# Patient Record
Sex: Female | Born: 1980 | Race: White | Hispanic: No | Smoking: Former smoker
Health system: Southern US, Community
[De-identification: ages and names within clinical notes are randomized; demographics above are authoritative.]

## PROBLEM LIST (undated history)

## (undated) DIAGNOSIS — Z789 Other specified health status: Secondary | ICD-10-CM

## (undated) HISTORY — PX: NO PAST SURGERIES: SHX2092

---

## 2013-05-25 ENCOUNTER — Emergency Department (HOSPITAL_COMMUNITY)
Admission: EM | Admit: 2013-05-25 | Discharge: 2013-05-25 | Disposition: A | Discharge status: Home / Self Care | Payer: Medicaid Other | Attending: Emergency Medicine | Admitting: Emergency Medicine

## 2013-05-25 ENCOUNTER — Emergency Department (HOSPITAL_COMMUNITY): Payer: Medicaid Other

## 2013-05-25 ENCOUNTER — Encounter (HOSPITAL_COMMUNITY): Payer: Self-pay | Admitting: Emergency Medicine

## 2013-05-25 DIAGNOSIS — Z23 Encounter for immunization: Secondary | ICD-10-CM | POA: Insufficient documentation

## 2013-05-25 DIAGNOSIS — Z3202 Encounter for pregnancy test, result negative: Secondary | ICD-10-CM | POA: Insufficient documentation

## 2013-05-25 DIAGNOSIS — F17200 Nicotine dependence, unspecified, uncomplicated: Secondary | ICD-10-CM | POA: Insufficient documentation

## 2013-05-25 DIAGNOSIS — S61209A Unspecified open wound of unspecified finger without damage to nail, initial encounter: Secondary | ICD-10-CM | POA: Insufficient documentation

## 2013-05-25 DIAGNOSIS — S2232XA Fracture of one rib, left side, initial encounter for closed fracture: Secondary | ICD-10-CM

## 2013-05-25 DIAGNOSIS — S3981XA Other specified injuries of abdomen, initial encounter: Secondary | ICD-10-CM | POA: Insufficient documentation

## 2013-05-25 DIAGNOSIS — T07XXXA Unspecified multiple injuries, initial encounter: Secondary | ICD-10-CM

## 2013-05-25 DIAGNOSIS — S62609B Fracture of unspecified phalanx of unspecified finger, initial encounter for open fracture: Secondary | ICD-10-CM

## 2013-05-25 DIAGNOSIS — S2249XA Multiple fractures of ribs, unspecified side, initial encounter for closed fracture: Secondary | ICD-10-CM | POA: Insufficient documentation

## 2013-05-25 LAB — POCT I-STAT, CHEM 8
Chloride: 103 mEq/L (ref 96–112)
HCT: 45 % (ref 36.0–46.0)
Hemoglobin: 15.3 g/dL — ABNORMAL HIGH (ref 12.0–15.0)
Potassium: 4.3 mEq/L (ref 3.5–5.1)
Sodium: 136 mEq/L (ref 135–145)

## 2013-05-25 LAB — POCT PREGNANCY, URINE: Preg Test, Ur: NEGATIVE

## 2013-05-25 MED ORDER — CEPHALEXIN 500 MG PO CAPS: 500.0000 mg | ORAL_CAPSULE | Freq: Three times a day (TID) | ORAL | Status: DC

## 2013-05-25 MED ORDER — MORPHINE SULFATE 4 MG/ML IJ SOLN
4.0000 mg | Freq: Once | INTRAMUSCULAR | Status: AC
  Administered 2013-05-25: 4 mg via INTRAVENOUS
  Filled 2013-05-25: qty 1

## 2013-05-25 MED ORDER — OXYCODONE-ACETAMINOPHEN 5-325 MG PO TABS
1.0000 | ORAL_TABLET | Freq: Once | ORAL | Status: AC
  Administered 2013-05-25: 1 via ORAL
  Filled 2013-05-25: qty 1

## 2013-05-25 MED ORDER — IOHEXOL 300 MG/ML  SOLN
100.0000 mL | Freq: Once | INTRAMUSCULAR | Status: AC | PRN
  Administered 2013-05-25: 100 mL via INTRAVENOUS

## 2013-05-25 MED ORDER — FENTANYL CITRATE 0.05 MG/ML IJ SOLN
50.0000 ug | Freq: Once | INTRAMUSCULAR | Status: AC
  Administered 2013-05-25: 50 ug via INTRAVENOUS
  Filled 2013-05-25: qty 2

## 2013-05-25 MED ORDER — OXYCODONE-ACETAMINOPHEN 5-325 MG PO TABS: 1.0000 | ORAL_TABLET | ORAL | Status: DC | PRN

## 2013-05-25 MED ORDER — CLINDAMYCIN HCL 150 MG PO CAPS: 300.0000 mg | ORAL_CAPSULE | Freq: Four times a day (QID) | ORAL | Status: DC

## 2013-05-25 MED ORDER — BUPIVACAINE HCL (PF) 0.25 % IJ SOLN
10.0000 mL | Freq: Once | INTRAMUSCULAR | Status: AC
  Administered 2013-05-25: 10 mL

## 2013-05-25 MED ORDER — ONDANSETRON HCL 4 MG/2ML IJ SOLN
4.0000 mg | Freq: Once | INTRAMUSCULAR | Status: AC
  Administered 2013-05-25: 4 mg via INTRAVENOUS
  Filled 2013-05-25: qty 2

## 2013-05-25 MED ORDER — HYDROCODONE-ACETAMINOPHEN 5-325 MG PO TABS: 1.0000 | ORAL_TABLET | Freq: Four times a day (QID) | ORAL | Status: DC | PRN

## 2013-05-25 MED ORDER — BUPIVACAINE HCL 0.25 % IJ SOLN
5.0000 mL | Freq: Once | INTRAMUSCULAR | Status: DC
  Filled 2013-05-25: qty 5

## 2013-05-25 MED ORDER — TETANUS-DIPHTH-ACELL PERTUSSIS 5-2.5-18.5 LF-MCG/0.5 IM SUSP
0.5000 mL | Freq: Once | INTRAMUSCULAR | Status: AC
  Administered 2013-05-25: 0.5 mL via INTRAMUSCULAR
  Filled 2013-05-25: qty 0.5

## 2013-05-25 NOTE — ED Notes (Signed)
PA at bedside.
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
## 2013-05-25 NOTE — ED Notes (Signed)
Spoke with Efraim Kaufmann, charge RN - patient will move to POD E when room available.

## 2013-05-25 NOTE — ED Notes (Signed)
Laceration to first left finger wrapped in petroleum gauze and cling.

## 2013-05-25 NOTE — ED Notes (Signed)
PA at bedside for procedure.
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
## 2013-05-25 NOTE — ED Notes (Signed)
Ortho at bedside.
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
## 2013-05-25 NOTE — ED Notes (Signed)
Dr. Thompson at bedside. 

## 2013-05-27 NOTE — ED Provider Notes (Signed)
Medical screening examination/treatment/procedure(s) were performed by non-physician practitioner and as supervising physician I was immediately available for consultation/collaboration.   Loren Racer, MD 05/27/13 [phone]

## 2013-06-17 ENCOUNTER — Ambulatory Visit: Payer: Medicaid Other | Attending: Orthopedic Surgery | Admitting: Occupational Therapy

## 2013-06-17 DIAGNOSIS — IMO0001 Reserved for inherently not codable concepts without codable children: Secondary | ICD-10-CM | POA: Insufficient documentation

## 2013-06-17 DIAGNOSIS — M25649 Stiffness of unspecified hand, not elsewhere classified: Secondary | ICD-10-CM | POA: Insufficient documentation

## 2013-06-17 DIAGNOSIS — M6281 Muscle weakness (generalized): Secondary | ICD-10-CM | POA: Insufficient documentation

## 2013-06-17 DIAGNOSIS — M25549 Pain in joints of unspecified hand: Secondary | ICD-10-CM | POA: Insufficient documentation

## 2013-06-22 ENCOUNTER — Ambulatory Visit: Payer: Medicaid Other | Admitting: Occupational Therapy

## 2013-07-15 ENCOUNTER — Ambulatory Visit: Payer: Medicaid Other | Attending: Orthopedic Surgery | Admitting: Occupational Therapy

## 2013-07-15 DIAGNOSIS — M25549 Pain in joints of unspecified hand: Secondary | ICD-10-CM | POA: Insufficient documentation

## 2013-07-15 DIAGNOSIS — IMO0001 Reserved for inherently not codable concepts without codable children: Secondary | ICD-10-CM | POA: Insufficient documentation

## 2013-07-15 DIAGNOSIS — M25649 Stiffness of unspecified hand, not elsewhere classified: Secondary | ICD-10-CM | POA: Insufficient documentation

## 2013-07-15 DIAGNOSIS — M6281 Muscle weakness (generalized): Secondary | ICD-10-CM | POA: Insufficient documentation

## 2013-07-20 ENCOUNTER — Ambulatory Visit: Payer: Medicaid Other | Admitting: Occupational Therapy
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
## 2022-11-04 NOTE — L&D Delivery Note (Cosign Needed Addendum)
LABOR COURSE Initial CVE 5/90/-2 and progressed to complete  Delivery Note Called to room and patient was complete and pushing. Head delivered ROP. No nuchal cord present. Shoulder and body delivered in usual fashion. At  1546 a viable and healthy female was delivered via Vaginal, Spontaneous (Presentation: vertex; ROP).  Infant with spontaneous cry, placed on mother's abdomen, dried and stimulated. Cord clamped x 2 after 1-minute delay, and cut by FOB. Cord blood drawn. Placenta delivered spontaneously with gentle cord traction. Appears intact. Fundus firm with massage, pitocin, TXA, and methergine. Labia, perineum, vagina, and cervix inspected with 1st degree perineal laceration.    APGAR: 9, 9; weight  8lb4oz.   Cord: 3VC with the following complications:none.   Cord pH: not sent  Anesthesia:  epidural Episiotomy: None Lacerations: 1st degree perineal Suture Repair: 2.0 chromic Est. Blood Loss (mL): 712  Mom to postpartum.  Baby to Couplet care / Skin to Skin.  Everhart, Kirstie, DO PGY - 1 4:35 PM     Fellow ATTESTATION  I was present and gloved for this delivery and agree with the above documentation in the resident's note.  Alfredia Ferguson, MD/[facility] (Faculty Practice) 06/10/2023, 8:34 PM

## 2023-06-10 ENCOUNTER — Inpatient Hospital Stay (HOSPITAL_COMMUNITY): Payer: Medicaid - Out of State | Admitting: Anesthesiology

## 2023-06-10 ENCOUNTER — Encounter (HOSPITAL_COMMUNITY): Payer: Self-pay | Admitting: Obstetrics and Gynecology

## 2023-06-10 ENCOUNTER — Inpatient Hospital Stay (HOSPITAL_BASED_OUTPATIENT_CLINIC_OR_DEPARTMENT_OTHER): Payer: Medicaid - Out of State

## 2023-06-10 ENCOUNTER — Inpatient Hospital Stay (HOSPITAL_COMMUNITY)
Admission: AD | Admit: 2023-06-10 | Discharge: 2023-06-12 | DRG: 806 | Disposition: A | Discharge status: Home / Self Care | Payer: Medicaid - Out of State | Attending: Obstetrics and Gynecology | Admitting: Obstetrics and Gynecology

## 2023-06-10 ENCOUNTER — Other Ambulatory Visit: Payer: Self-pay

## 2023-06-10 DIAGNOSIS — O09523 Supervision of elderly multigravida, third trimester: Secondary | ICD-10-CM

## 2023-06-10 DIAGNOSIS — O0930 Supervision of pregnancy with insufficient antenatal care, unspecified trimester: Secondary | ICD-10-CM

## 2023-06-10 DIAGNOSIS — Z3A39 39 weeks gestation of pregnancy: Secondary | ICD-10-CM | POA: Diagnosis not present

## 2023-06-10 DIAGNOSIS — O0933 Supervision of pregnancy with insufficient antenatal care, third trimester: Secondary | ICD-10-CM | POA: Diagnosis not present

## 2023-06-10 DIAGNOSIS — O09529 Supervision of elderly multigravida, unspecified trimester: Secondary | ICD-10-CM

## 2023-06-10 DIAGNOSIS — Z87891 Personal history of nicotine dependence: Secondary | ICD-10-CM

## 2023-06-10 DIAGNOSIS — O9081 Anemia of the puerperium: Secondary | ICD-10-CM | POA: Diagnosis not present

## 2023-06-10 DIAGNOSIS — O26893 Other specified pregnancy related conditions, third trimester: Secondary | ICD-10-CM | POA: Diagnosis present

## 2023-06-10 DIAGNOSIS — Z3A38 38 weeks gestation of pregnancy: Secondary | ICD-10-CM

## 2023-06-10 DIAGNOSIS — Z23 Encounter for immunization: Secondary | ICD-10-CM

## 2023-06-10 DIAGNOSIS — D62 Acute posthemorrhagic anemia: Secondary | ICD-10-CM | POA: Diagnosis not present

## 2023-06-10 HISTORY — DX: Other specified health status: Z78.9

## 2023-06-10 LAB — CBC WITH DIFFERENTIAL/PLATELET
Abs Immature Granulocytes: 0.23 10*3/uL — ABNORMAL HIGH (ref 0.00–0.07)
Basophils Absolute: 0.1 10*3/uL (ref 0.0–0.1)
Basophils Relative: 0 %
Eosinophils Absolute: 0 10*3/uL (ref 0.0–0.5)
Eosinophils Relative: 0 %
HCT: 28.5 % — ABNORMAL LOW (ref 36.0–46.0)
Hemoglobin: 8.9 g/dL — ABNORMAL LOW (ref 12.0–15.0)
Immature Granulocytes: 1 %
Lymphocytes Relative: 9 %
Lymphs Abs: 1.8 10*3/uL (ref 0.7–4.0)
MCH: 17.5 pg — ABNORMAL LOW (ref 26.0–34.0)
MCHC: 31.2 g/dL (ref 30.0–36.0)
MCV: 56.1 fL — ABNORMAL LOW (ref 80.0–100.0)
Monocytes Absolute: 1.7 10*3/uL — ABNORMAL HIGH (ref 0.1–1.0)
Monocytes Relative: 8 %
Neutro Abs: 16.6 10*3/uL — ABNORMAL HIGH (ref 1.7–7.7)
Neutrophils Relative %: 82 %
Platelets: 200 10*3/uL (ref 150–400)
RBC: 5.08 MIL/uL (ref 3.87–5.11)
RDW: 19.5 % — ABNORMAL HIGH (ref 11.5–15.5)
WBC: 20.3 10*3/uL — ABNORMAL HIGH (ref 4.0–10.5)
nRBC: 0.1 % (ref 0.0–0.2)

## 2023-06-10 LAB — HEPATITIS B SURFACE ANTIGEN: Hepatitis B Surface Ag: NONREACTIVE

## 2023-06-10 LAB — CBC
HCT: 34.5 % — ABNORMAL LOW (ref 36.0–46.0)
Hemoglobin: 10.7 g/dL — ABNORMAL LOW (ref 12.0–15.0)
MCH: 17.6 pg — ABNORMAL LOW (ref 26.0–34.0)
MCHC: 31 g/dL (ref 30.0–36.0)
MCV: 56.7 fL — ABNORMAL LOW (ref 80.0–100.0)
Platelets: 209 10*3/uL (ref 150–400)
RBC: 6.08 MIL/uL — ABNORMAL HIGH (ref 3.87–5.11)
RDW: 20.4 % — ABNORMAL HIGH (ref 11.5–15.5)
WBC: 10.6 10*3/uL — ABNORMAL HIGH (ref 4.0–10.5)
nRBC: 0 % (ref 0.0–0.2)

## 2023-06-10 LAB — GROUP B STREP BY PCR: Group B strep by PCR: NEGATIVE

## 2023-06-10 LAB — TYPE AND SCREEN
ABO/RH(D): B POS
Antibody Screen: NEGATIVE

## 2023-06-10 LAB — HEMOGLOBIN A1C
Hgb A1c MFr Bld: 5.6 % (ref 4.8–5.6)
Mean Plasma Glucose: 114.02 mg/dL

## 2023-06-10 LAB — RPR: RPR Ser Ql: NONREACTIVE

## 2023-06-10 LAB — HEPATITIS C ANTIBODY: HCV Ab: NONREACTIVE

## 2023-06-10 LAB — HIV ANTIBODY (ROUTINE TESTING W REFLEX): HIV Screen 4th Generation wRfx: NONREACTIVE

## 2023-06-10 MED ORDER — SOD CITRATE-CITRIC ACID 500-334 MG/5ML PO SOLN: 30.0000 mL | ORAL | Status: DC | PRN

## 2023-06-10 MED ORDER — SODIUM CHLORIDE 0.9 % IV SOLN
2.0000 g | Freq: Once | INTRAVENOUS | Status: AC
  Administered 2023-06-10: 2 g via INTRAVENOUS
  Filled 2023-06-10: qty 2000

## 2023-06-10 MED ORDER — ONDANSETRON HCL 4 MG/2ML IJ SOLN
4.0000 mg | Freq: Four times a day (QID) | INTRAMUSCULAR | Status: DC | PRN
  Administered 2023-06-10: 4 mg via INTRAVENOUS
  Filled 2023-06-10: qty 2

## 2023-06-10 MED ORDER — LACTATED RINGERS IV SOLN
500.0000 mL | Freq: Once | INTRAVENOUS | Status: AC
  Administered 2023-06-10: 500 mL via INTRAVENOUS

## 2023-06-10 MED ORDER — OXYTOCIN BOLUS FROM INFUSION
333.0000 mL | Freq: Once | INTRAVENOUS | Status: AC
  Administered 2023-06-10: 333 mL via INTRAVENOUS

## 2023-06-10 MED ORDER — ONDANSETRON HCL 4 MG/2ML IJ SOLN: 4.0000 mg | INTRAMUSCULAR | Status: DC | PRN

## 2023-06-10 MED ORDER — EPHEDRINE 5 MG/ML INJ: 10.0000 mg | INTRAVENOUS | Status: DC | PRN

## 2023-06-10 MED ORDER — MEASLES, MUMPS & RUBELLA VAC IJ SOLR: 0.5000 mL | Freq: Once | INTRAMUSCULAR | Status: DC

## 2023-06-10 MED ORDER — FENTANYL-BUPIVACAINE-NACL 0.5-0.125-0.9 MG/250ML-% EP SOLN
EPIDURAL | Status: DC | PRN
  Administered 2023-06-10: 12 mL/h via EPIDURAL

## 2023-06-10 MED ORDER — TRANEXAMIC ACID-NACL 1000-0.7 MG/100ML-% IV SOLN
INTRAVENOUS | Status: AC
  Filled 2023-06-10: qty 100

## 2023-06-10 MED ORDER — DIPHENHYDRAMINE HCL 25 MG PO CAPS: 25.0000 mg | ORAL_CAPSULE | Freq: Four times a day (QID) | ORAL | Status: DC | PRN

## 2023-06-10 MED ORDER — SIMETHICONE 80 MG PO CHEW: 80.0000 mg | CHEWABLE_TABLET | ORAL | Status: DC | PRN

## 2023-06-10 MED ORDER — FENTANYL-BUPIVACAINE-NACL 0.5-0.125-0.9 MG/250ML-% EP SOLN
12.0000 mL/h | EPIDURAL | Status: DC | PRN
  Filled 2023-06-10: qty 250

## 2023-06-10 MED ORDER — SODIUM CHLORIDE 0.9 % IV SOLN: 250.0000 mL | INTRAVENOUS | Status: DC | PRN

## 2023-06-10 MED ORDER — FENTANYL CITRATE (PF) 100 MCG/2ML IJ SOLN: 50.0000 ug | INTRAMUSCULAR | Status: DC | PRN

## 2023-06-10 MED ORDER — OXYTOCIN-SODIUM CHLORIDE 30-0.9 UT/500ML-% IV SOLN
1.0000 m[IU]/min | INTRAVENOUS | Status: DC
  Administered 2023-06-10: 2 m[IU]/min via INTRAVENOUS

## 2023-06-10 MED ORDER — TRANEXAMIC ACID-NACL 1000-0.7 MG/100ML-% IV SOLN
1000.0000 mg | INTRAVENOUS | Status: AC
  Administered 2023-06-10: 1000 mg via INTRAVENOUS

## 2023-06-10 MED ORDER — OXYTOCIN-SODIUM CHLORIDE 30-0.9 UT/500ML-% IV SOLN
2.5000 [IU]/h | INTRAVENOUS | Status: DC
  Administered 2023-06-10: 2.5 [IU]/h via INTRAVENOUS
  Filled 2023-06-10: qty 500

## 2023-06-10 MED ORDER — MISOPROSTOL 200 MCG PO TABS: 1000.0000 ug | ORAL_TABLET | Freq: Once | ORAL | Status: AC

## 2023-06-10 MED ORDER — MISOPROSTOL 200 MCG PO TABS
ORAL_TABLET | ORAL | Status: AC
  Administered 2023-06-10: 1000 ug via RECTAL
  Filled 2023-06-10: qty 1

## 2023-06-10 MED ORDER — LACTATED RINGERS IV SOLN
500.0000 mL | INTRAVENOUS | Status: DC | PRN
  Administered 2023-06-10: 500 mL via INTRAVENOUS

## 2023-06-10 MED ORDER — BUPIVACAINE HCL (PF) 0.25 % IJ SOLN: INTRAMUSCULAR | Status: DC | PRN

## 2023-06-10 MED ORDER — CEFAZOLIN SODIUM-DEXTROSE 2-4 GM/100ML-% IV SOLN
2.0000 g | Freq: Once | INTRAVENOUS | Status: AC
  Administered 2023-06-10: 2 g via INTRAVENOUS
  Filled 2023-06-10: qty 100

## 2023-06-10 MED ORDER — PHENYLEPHRINE 80 MCG/ML (10ML) SYRINGE FOR IV PUSH (FOR BLOOD PRESSURE SUPPORT): 80.0000 ug | PREFILLED_SYRINGE | INTRAVENOUS | Status: DC | PRN

## 2023-06-10 MED ORDER — SENNOSIDES-DOCUSATE SODIUM 8.6-50 MG PO TABS
2.0000 | ORAL_TABLET | ORAL | Status: DC
  Administered 2023-06-11 – 2023-06-12 (×2): 2 via ORAL
  Filled 2023-06-10 (×2): qty 2

## 2023-06-10 MED ORDER — ZOLPIDEM TARTRATE 5 MG PO TABS: 5.0000 mg | ORAL_TABLET | Freq: Every evening | ORAL | Status: DC | PRN

## 2023-06-10 MED ORDER — ACETAMINOPHEN 325 MG PO TABS
650.0000 mg | ORAL_TABLET | ORAL | Status: DC | PRN
  Administered 2023-06-10 (×2): 650 mg via ORAL
  Filled 2023-06-10 (×2): qty 2

## 2023-06-10 MED ORDER — BUPIVACAINE HCL (PF) 0.25 % IJ SOLN
INTRAMUSCULAR | Status: DC | PRN
  Administered 2023-06-10: 1 mL via INTRATHECAL

## 2023-06-10 MED ORDER — SODIUM CHLORIDE 0.9% FLUSH: 3.0000 mL | Freq: Two times a day (BID) | INTRAVENOUS | Status: DC

## 2023-06-10 MED ORDER — DIBUCAINE (PERIANAL) 1 % EX OINT: 1.0000 | TOPICAL_OINTMENT | CUTANEOUS | Status: DC | PRN

## 2023-06-10 MED ORDER — METHYLERGONOVINE MALEATE 0.2 MG/ML IJ SOLN
0.2000 mg | Freq: Once | INTRAMUSCULAR | Status: AC
  Administered 2023-06-10: 0.2 mg via INTRAMUSCULAR

## 2023-06-10 MED ORDER — PRENATAL MULTIVITAMIN CH
1.0000 | ORAL_TABLET | Freq: Every day | ORAL | Status: DC
  Administered 2023-06-11 – 2023-06-12 (×2): 1 via ORAL
  Filled 2023-06-10 (×2): qty 1

## 2023-06-10 MED ORDER — TETANUS-DIPHTH-ACELL PERTUSSIS 5-2.5-18.5 LF-MCG/0.5 IM SUSY
0.5000 mL | PREFILLED_SYRINGE | Freq: Once | INTRAMUSCULAR | Status: AC
  Administered 2023-06-11: 0.5 mL via INTRAMUSCULAR
  Filled 2023-06-10: qty 0.5

## 2023-06-10 MED ORDER — SODIUM CHLORIDE 0.9% FLUSH: 3.0000 mL | INTRAVENOUS | Status: DC | PRN

## 2023-06-10 MED ORDER — METHYLERGONOVINE MALEATE 0.2 MG/ML IJ SOLN
INTRAMUSCULAR | Status: AC
  Filled 2023-06-10: qty 1

## 2023-06-10 MED ORDER — COCONUT OIL OIL: 1.0000 | TOPICAL_OIL | Status: DC | PRN

## 2023-06-10 MED ORDER — BENZOCAINE-MENTHOL 20-0.5 % EX AERO: 1.0000 | INHALATION_SPRAY | CUTANEOUS | Status: DC | PRN

## 2023-06-10 MED ORDER — IBUPROFEN 600 MG PO TABS
600.0000 mg | ORAL_TABLET | Freq: Four times a day (QID) | ORAL | Status: DC
  Administered 2023-06-10 – 2023-06-12 (×9): 600 mg via ORAL
  Filled 2023-06-10 (×9): qty 1

## 2023-06-10 MED ORDER — TERBUTALINE SULFATE 1 MG/ML IJ SOLN: 0.2500 mg | Freq: Once | INTRAMUSCULAR | Status: DC | PRN

## 2023-06-10 MED ORDER — LIDOCAINE HCL (PF) 1 % IJ SOLN: 30.0000 mL | INTRAMUSCULAR | Status: DC | PRN

## 2023-06-10 MED ORDER — SODIUM CHLORIDE 0.9 % IV SOLN: 1.0000 g | INTRAVENOUS | Status: DC

## 2023-06-10 MED ORDER — ACETAMINOPHEN 325 MG PO TABS: 650.0000 mg | ORAL_TABLET | ORAL | Status: DC | PRN

## 2023-06-10 MED ORDER — DIPHENHYDRAMINE HCL 50 MG/ML IJ SOLN: 12.5000 mg | INTRAMUSCULAR | Status: DC | PRN

## 2023-06-10 MED ORDER — ONDANSETRON HCL 4 MG PO TABS: 4.0000 mg | ORAL_TABLET | ORAL | Status: DC | PRN

## 2023-06-10 MED ORDER — MISOPROSTOL 200 MCG PO TABS
ORAL_TABLET | ORAL | Status: AC
  Filled 2023-06-10: qty 5

## 2023-06-10 MED ORDER — PHENYLEPHRINE 80 MCG/ML (10ML) SYRINGE FOR IV PUSH (FOR BLOOD PRESSURE SUPPORT)
80.0000 ug | PREFILLED_SYRINGE | INTRAVENOUS | Status: DC | PRN
  Filled 2023-06-10: qty 10

## 2023-06-10 MED ORDER — LACTATED RINGERS IV SOLN: INTRAVENOUS | Status: DC

## 2023-06-10 MED ORDER — WITCH HAZEL-GLYCERIN EX PADS: 1.0000 | MEDICATED_PAD | CUTANEOUS | Status: DC | PRN

## 2023-06-10 NOTE — Lactation Note (Signed)
This note was copied from a baby's chart. Lactation Consultation Note  Patient Name: Catherine Baxter [id number] Date: 06/10/2023 Age:43 hours  Mom is breast/formula feeding. Declines Lactation services.   Maternal Data    Feeding Nipple Type: Nfant Extra Slow Flow (gold)  LATCH Score                    Lactation Tools Discussed/Used    Interventions    Discharge    Consult Status Consult Status: Complete    Hollye Pritt G 06/10/2023, 7:37 PM

## 2023-06-10 NOTE — Anesthesia Procedure Notes (Addendum)
Epidural Patient location during procedure: OB Start time: 06/10/2023 7:47 AM End time: 06/10/2023 7:57 AM  Staffing Anesthesiologist: Lannie Fields, DO Performed: anesthesiologist   Preanesthetic Checklist Completed: patient identified, IV checked, risks and benefits discussed, monitors and equipment checked, pre-op evaluation and timeout performed  Epidural Patient position: sitting Prep: DuraPrep and site prepped and draped Patient monitoring: continuous pulse ox, blood pressure, heart rate and cardiac monitor Approach: midline Location: L3-L4 Injection technique: LOR air  Needle:  Needle type: Tuohy  Needle gauge: 17 G Needle length: 9 cm Needle insertion depth: 5 cm Catheter type: closed end flexible Catheter size: 19 Gauge Catheter at skin depth: 10 cm Test dose: negative  Assessment Sensory level: T8 Events: blood not aspirated, no cerebrospinal fluid, injection not painful, no injection resistance, no paresthesia and negative IV test  Additional Notes Patient identified. Risks/Benefits/Options discussed with patient including but not limited to bleeding, infection, nerve damage, paralysis, failed block, incomplete pain control, headache, blood pressure changes, nausea, vomiting, reactions to medication both or allergic, itching and postpartum back pain. Confirmed with bedside nurse the patient's most recent platelet count. Confirmed with patient that they are not currently taking any anticoagulation, have any bleeding history or any family history of bleeding disorders. Patient expressed understanding and wished to proceed. All questions were answered. Sterile technique was used throughout the entire procedure. Please see nursing notes for vital signs. Test dose was given through epidural catheter and negative prior to continuing to dose epidural or start infusion. Warning signs of high block given to the patient including shortness of breath, tingling/numbness in  hands, complete motor block, or any concerning symptoms with instructions to call for help. Patient was given instructions on fall risk and not to get out of bed. All questions and concerns addressed with instructions to call with any issues or inadequate analgesia.    CSE performed with 24G pencan through tuohy, clear CSF, no issues.Reason for block:procedure for pain

## 2023-06-10 NOTE — Anesthesia Preprocedure Evaluation (Signed)
Anesthesia Evaluation  Patient identified by MRN, date of birth, ID band Patient awake    Reviewed: Allergy & Precautions, Patient's Chart, lab work & pertinent test results  Airway Mallampati: II  TM Distance: >3 FB Neck ROM: Full    Dental no notable dental hx.    Pulmonary neg pulmonary ROS, former smoker   Pulmonary exam normal breath sounds clear to auscultation       Cardiovascular negative cardio ROS Normal cardiovascular exam Rhythm:Regular Rate:Normal     Neuro/Psych negative neurological ROS  negative psych ROS   GI/Hepatic negative GI ROS, Neg liver ROS,,,  Endo/Other  negative endocrine ROS    Renal/GU negative Renal ROS  negative genitourinary   Musculoskeletal negative musculoskeletal ROS (+)    Abdominal   Peds negative pediatric ROS (+)  Hematology  (+) Blood dyscrasia, anemia Hb 10.7, plt 209   Anesthesia Other Findings   Reproductive/Obstetrics (+) Pregnancy                             Anesthesia Physical Anesthesia Plan  ASA: 2  Anesthesia Plan: Combined Spinal and Epidural   Post-op Pain Management:    Induction:   PONV Risk Score and Plan: 2  Airway Management Planned: Natural Airway  Additional Equipment: None  Intra-op Plan:   Post-operative Plan:   Informed Consent: I have reviewed the patients History and Physical, chart, labs and discussed the procedure including the risks, benefits and alternatives for the proposed anesthesia with the patient or authorized representative who has indicated his/her understanding and acceptance.       Plan Discussed with:   Anesthesia Plan Comments: (5th pregnancy, hx precipitous delivery- will proceed w/ CSE)       Anesthesia Quick Evaluation

## 2023-06-10 NOTE — Progress Notes (Signed)
Labor Progress Note Iceis Ebner is a 42 y.o. G5P4004 at [redacted]w[redacted]d presented for SOL, NPC S: Resting comfortably with epidural  O:  BP 121/71   Pulse 100   Temp 97.6 F (36.4 C) (Axillary)   Resp 17   Ht 5\' 2"  (1.575 m)   Wt 68.5 kg   BMI 27.62 kg/m  EFM: 130/moderate/15x15/no decels  CVE: Dilation: 8 Effacement (%): 90 Station: -2, -1 Presentation: Vertex Exam by:: Brynda Greathouse, RN   A&P: 42 y.o. [id number] [redacted]w[redacted]d presented for SOL, NPC #Labor: Progressing well. AROM [id number] clear/bloody show.  #Pain: epidural #FWB: cat 1 #GBS  pending , ampicillin ppx   No prenatal care: GC swab to be collected, RPR non reactive, HCAb, HBsAg, Rubella, HIV pending, A1C 5.6, CBC Hgb 10.7. UDS pending. Formal BSUS confirms IUP at 39 weeks, vertex. SW consult pp.   Jennifier Smitherman, DO 10:02 AM

## 2023-06-10 NOTE — MAU Note (Signed)
Pt arrived saying - Doesn't know LMP- or EDC.- but says she's due in Oct. Is from [place]-  Had 1 PNV  Denies HSV Has bloody show  G5P4- all vag

## 2023-06-10 NOTE — H&P (Signed)
  History     CSN: [id number]  Arrival date and time: 06/10/23 6440   Event Date/Time   First Provider Initiated Contact with Patient 06/10/23 [phone]      S: Ms. Catherine Baxter is a 42 y.o. G5P4004 at Unknown who has had no prenatal care.  She presents today for contractions. She reports contractions started at 0530. She states her EDD is in October, but is unsure of the exact date.  She reports some vaginal bleeding, but denies SROM.   Patient reports she had one prenatal visit, but review of paperwork reveals it was clinic visit for UPT confirming pregnancy.   OB History     Gravida  5   Para  4   Term  4   Preterm      AB  0   Living  4      SAB  0   IAB  0   Ectopic  0   Multiple      Live Births  4      Past Medical History:  Diagnosis Date   Medical history non-contributory    Past Surgical History:  Procedure Laterality Date   NO PAST SURGERIES      Family History: family history is not on file. Social History:  reports that she has quit smoking. Her smoking use included cigarettes. She has quit using smokeless tobacco. She reports that she does not currently use alcohol. She reports that she does not currently use drugs after having used the following drugs: Marijuana.  O: There were no vitals taken for this visit. GENERAL: Well-developed, well-nourished female in no acute distress.  HEAD: Normocephalic, atraumatic.  CHEST: Normal effort of breathing, regular heart rate ABDOMEN: Soft, nontender, gravid, FH 40cm  Cervical exam:  Dilation: 5 Effacement (%): 90 Station: -2 Presentation: Vertex Exam by:: Shanda Bumps, CNM Bloody Show Noted  Fetal Monitoring: Baseline: 130 Variability: Moderate Accelerations: None Decelerations: None Contractions: Palpates moderate   A: SIUP at [redacted]w[redacted]d  Active labor No Prenatal Care  P: Formal BSUS confirms IUP at 38.5 weeks Labs ordered Drug Screen and SW Consult needed for No PNC Admit to labor  L&D  Team notified   Cherre Robins MSN, CNM 06/10/2023, 6:37 AM

## 2023-06-11 ENCOUNTER — Encounter (HOSPITAL_COMMUNITY): Payer: Self-pay | Admitting: Obstetrics and Gynecology

## 2023-06-11 DIAGNOSIS — O09529 Supervision of elderly multigravida, unspecified trimester: Secondary | ICD-10-CM

## 2023-06-11 LAB — RUBELLA SCREEN: Rubella: 1.2 {index}

## 2023-06-11 MED ORDER — MEDROXYPROGESTERONE ACETATE 150 MG/ML IM SUSP
150.0000 mg | Freq: Once | INTRAMUSCULAR | Status: AC
  Administered 2023-06-11: 150 mg via INTRAMUSCULAR
  Filled 2023-06-11: qty 1

## 2023-06-11 MED ORDER — FERROUS SULFATE 325 (65 FE) MG PO TABS
325.0000 mg | ORAL_TABLET | ORAL | Status: DC
  Administered 2023-06-11: 325 mg via ORAL
  Filled 2023-06-11: qty 1

## 2023-06-11 NOTE — Clinical Social Work Maternal (Signed)
CLINICAL SOCIAL WORK MATERNAL/CHILD NOTE   Patient Details  Name: Catherine Baxter MRN: [id number] Date of Birth:    Date:  2024/02/06   Clinical Social Worker Initiating Note:  Willaim Rayas Isayah Ignasiak      Date/Time: Initiated:  06/11/23/1508      Child's Name:  Gilmore Laroche 03-18-2023    Biological Parents:  Mother, Father Catherine Baxter , Catherine Baxter 03/24/1981)    Need for Interpreter:  None    Reason for Referral:  Late or No Prenatal Care      Address:  [address] [place] [address]    Phone number:  [phone] (home)      Additional phone number:    Household Members/Support Persons (HM/SP):   Household Member/Support Person 1, Household Member/Support Person 2, Household Member/Support Person 3     HM/SP Name Relationship DOB or Age  HM/SP -1 Arvintino Lea significant other 03/24/1981  HM/SP -2 Roseanne Reno Phommachanh son 07/04/2009  HM/SP -3 Shawnee Knapp daughter 12/23/2020  HM/SP -4     HM/SP -5     HM/SP -6     HM/SP -7     HM/SP -8         Natural Supports (not living in the home):  Extended Family, Landscape architect Supports: None    Employment: Unemployed    Type of Work:      Education:  Some Economist arranged:     Surveyor, quantity Resources:  Media planner     Other Resources:  Sales executive  , WIC    Cultural/Religious Considerations Which May Impact Care:     Strengths:  Ability to meet basic needs  , Home prepared for child  , Pediatrician chosen    Psychotropic Medications:          Pediatrician:    [facility] area   Pediatrician List:    [facility]  [place]   [place]   [place] [facility]       Pediatrician Fax Number:     Risk Factors/Current Problems:  None    Cognitive State:  Alert  , Able to Concentrate      Mood/Affect:  Calm  , Interested      CSW Assessment: CSW received a consult for lack of prenatal care. CSW met with  MOB to complete assessment and offer support. CSW entered the room and observed MOB up eating, her room guest at bedside and the infant in the bassinet. CSW introduced self, CSW role and reason for visit, MOB was agreeable to visit and allowed room guest to remain in the room. CSW inquired about how MOB was feeling, MOB reported good. CSW inquired about MOB lack of prenatal care, MOB reported she recently moved here from [place] and was traveling back and forth a lot during the pregnancy. MOB reported she was seen in [place]. CSW explained the hospital drug screen policy due to lack of prenatal care, MOB verbalized understanding. CSW notified MOB infants UDS was negative and the CDS was pending, MOB denied any substance use during pregnancy.    CSW inquired about any MH hx, MOB denied any MH concerns. CSW provided education regarding the baby blues period vs. perinatal mood disorders, discussed treatment and gave resources for mental health follow up if concerns arise.  CSW recommends self-evaluation during the postpartum time period using the New Mom Checklist from Postpartum Progress and encouraged MOB to contact  a medical professional if symptoms are noted at any time. MOB identified her mom, FOB as her supports   CSW provided review of Sudden Infant Death Syndrome (SIDS) precautions.  MOB reported she has all necessary item for the infant including a bassinet, crib and car seat.  CSW identifies no further need for intervention and no barriers to discharge at this time.   CSW Plan/Description:  No Further Intervention Required/No Barriers to Discharge, Sudden Infant Death Syndrome (SIDS) Education, Perinatal Mood and Anxiety Disorder (PMADs) Education, Hospital Drug Screen Policy Information, CSW Will Continue to Monitor Umbilical Cord Tissue Drug Screen Results and Make Report if Sandy Salaam, LCSW Jan 22, 2023, 3:17 PM

## 2023-06-11 NOTE — Progress Notes (Addendum)
POSTPARTUM PROGRESS NOTE  Post Partum Day 1  Subjective:  Catherine Baxter is a 42 y.o. Y8M5784 s/p SVD at [redacted]w[redacted]d.  She reports she is doing well. No acute events overnight. She denies any problems with ambulating, voiding or po intake. Denies nausea or vomiting.  Patient has voided and stooled. Pain is well controlled.  Lochia is moderate with half a pad of bleeding every 2-3 hours.  Objective: Blood pressure (!) 104/45, pulse 87, temperature 97.7 F (36.5 C), temperature source Oral, resp. rate 16, height 5\' 2"  (1.575 m), weight 68.5 kg, SpO2 99%, unknown if currently breastfeeding.  Physical Exam:  General: alert, cooperative and no distress Chest: no respiratory distress Heart:regular rate, distal pulses intact Uterine Fundus: firm, appropriately tender. Fundal height 6 cm below umbilicus. DVT Evaluation: No calf swelling or tenderness Extremities: no LE edema Skin: warm, dry  Recent Labs    06/10/23 0700 06/10/23 2326  HGB 10.7* 8.9*  HCT 34.5* 28.5*    Assessment/Plan: Catherine Baxter is a 42 y.o. O9G2952 s/p SVD at [redacted]w[redacted]d   PPD#1 - Doing well  Routine postpartum care Microcytic anemia with normal RBC - likely 2/2 to acute blood loss. Asymptomatic.  - repeat CBC as above - Start PO iron supplementation Contraception: Plan for depo shot today, bilateral tubal ligation interval, in ~20 days Feeding: formula (Enfamil) Dispo: Plan for discharge today or tomorrow.   LOS: 1 day   Hanley Ben, Medical Student 06/11/2023, 7:42 AM    Fellow Attestation  I saw and evaluated the patient, performing the key elements of the service.I  personally performed or re-performed the history, physical exam, and medical decision making activities of this service and have verified that the service and findings are accurately documented in the medical student's note. I developed the management plan that is described in the medical student's note, and I agree with the content, with my  edits above.   Alfredia Ferguson, MD,[facility] OB Fellow, Faculty Practice  06/11/2023 2:30 PM

## 2023-06-11 NOTE — Anesthesia Postprocedure Evaluation (Signed)
Anesthesia Post Note  Patient: Gates Hingtgen  Procedure(s) Performed: AN AD HOC LABOR EPIDURAL     Patient location during evaluation: Mother Baby Anesthesia Type: Epidural Level of consciousness: awake and alert Pain management: pain level controlled Vital Signs Assessment: post-procedure vital signs reviewed and stable Respiratory status: spontaneous breathing, nonlabored ventilation and respiratory function stable Cardiovascular status: stable Postop Assessment: no headache, no backache and epidural receding Anesthetic complications: no   No notable events documented.  Last Vitals:  Vitals:   06/11/23 0156 06/11/23 0556  BP: 128/73 (!) 104/45  Pulse: 98 87  Resp: 16 16  Temp: 37.1 C 36.5 C  SpO2: 99% 99%    Last Pain:  Vitals:   06/11/23 0949  TempSrc:   PainSc: 0-No pain   Pain Goal:                   Salome Arnt

## 2023-06-11 NOTE — Discharge Summary (Signed)
Postpartum Discharge Summary  Date of Service updated***     Patient Name: Catherine Baxter DOB: 1981/08/19 MRN: [id number]  Date of admission: 06/10/2023 Delivery date:06/10/2023 Delivering provider: Alfredia Ferguson Date of discharge: 06/11/2023  Admitting diagnosis: Indication for care or intervention in labor or delivery [O75.9] Intrauterine pregnancy: [redacted]w[redacted]d     Secondary diagnosis:  Principal Problem:   Indication for care or intervention in labor or delivery Active Problems:   AMA (advanced maternal age) multigravida 35+  Additional problems: none    Discharge diagnosis: Term Pregnancy Delivered                                              Post partum procedures: none Augmentation: AROM Complications: None  Hospital course: Onset of Labor With Vaginal Delivery      42 y.o. yo G5P5005 at [redacted]w[redacted]d was admitted in Latent Labor on 06/10/2023. Labor course was complicated by not having prenatal care.  Membrane Rupture Time/Date: 9:21 AM,06/10/2023  Delivery Method:Vaginal, Spontaneous Operative Delivery:N/A Episiotomy: None Lacerations:  1st degree Patient had a postpartum course remarkable for having a SW consult with no barriers to d/c.  She is ambulating, tolerating a regular diet, passing flatus, and urinating well. Patient is discharged home in stable condition on 06/11/23.  Newborn Data: Birth date:06/10/2023 Birth time:3:46 PM Gender:Female Living status:Living Apgars:9 ,9  Weight:3730 g (8lb 3.6oz)  Magnesium Sulfate received: No BMZ received: No Rhophylac:N/A MMR:N/A T-DaP: offered postpartum Flu: No Transfusion:No  Physical exam  Vitals:   06/11/23 0156 06/11/23 0556 06/11/23 1409 06/11/23 2222  BP: 128/73 (!) 104/45 118/69 132/80  Pulse: 98 87 99 84  Resp: 16 16 18 19   Temp: 98.7 F (37.1 C) 97.7 F (36.5 C) 98 F (36.7 C) 98.4 F (36.9 C)  TempSrc: Oral Oral Oral Oral  SpO2: 99% 99% 100% 100%  Weight:      Height:       General: alert and  cooperative Lochia: appropriate Uterine Fundus: firm Incision: N/A DVT Evaluation: No evidence of DVT seen on physical exam. Labs: Lab Results  Component Value Date   WBC 20.3 (H) 06/10/2023   HGB 8.9 (L) 06/10/2023   HCT 28.5 (L) 06/10/2023   MCV 56.1 (L) 06/10/2023   PLT 200 06/10/2023      Latest Ref Rng & Units 05/25/2013   12:31 PM  CMP  Glucose 70 - 99 mg/dL 784   BUN 6 - 23 mg/dL 11   Creatinine 6.96 - 1.10 mg/dL 2.95   Sodium 284 - 132 mEq/L 136   Potassium 3.5 - 5.1 mEq/L 4.3   Chloride 96 - 112 mEq/L 103    Edinburgh Score:    06/10/2023   10:50 PM  Edinburgh Postnatal Depression Scale Screening Tool  I have been able to laugh and see the funny side of things. 0  I have looked forward with enjoyment to things. 0  I have blamed myself unnecessarily when things went wrong. 0  I have been anxious or worried for no good reason. 0  I have felt scared or panicky for no good reason. 0  Things have been getting on top of me. 0  I have been so unhappy that I have had difficulty sleeping. 0  I have felt sad or miserable. 0  I have been so unhappy that I have been crying. 0  The thought of harming myself has occurred to me. 0  Edinburgh Postnatal Depression Scale Total 0     After visit meds:  Allergies as of 06/11/2023   No Known Allergies   Med Rec must be completed prior to using this [facility]***        Discharge home in stable condition Infant Feeding: {Baby feeding:23562} Infant Disposition:{CHL IP OB HOME WITH ZOXWRU:04540} Discharge instruction: per After Visit Summary and Postpartum booklet. Activity: Advance as tolerated. Pelvic rest for 6 weeks.  Diet: routine diet Future Appointments:No future appointments. Follow up Visit:  Arabella Merles, CNM  P [facility] Admin Pool (No prenatal care)  Please schedule this patient for Postpartum visit in: 6 weeks with the following provider: Any provider In-Person For C/S patients schedule nurse incision  check in weeks 2 weeks: no Low risk pregnancy complicated by: no prenatal Delivery mode:  SVD Anticipated Birth Control:  other/unsure PP Procedures needed: none Schedule Integrated BH visit: no   06/11/2023 Arabella Merles, CNM

## 2023-06-12 DIAGNOSIS — O0930 Supervision of pregnancy with insufficient antenatal care, unspecified trimester: Secondary | ICD-10-CM

## 2023-06-12 MED ORDER — FERROUS SULFATE 325 (65 FE) MG PO TABS: 325.0000 mg | ORAL_TABLET | ORAL | 3 refills | Status: DC

## 2023-06-12 MED ORDER — IBUPROFEN 600 MG PO TABS: 600.0000 mg | ORAL_TABLET | Freq: Four times a day (QID) | ORAL | 0 refills | Status: DC | PRN

## 2023-07-03 ENCOUNTER — Telehealth (HOSPITAL_COMMUNITY): Payer: Self-pay

## 2023-07-03 NOTE — Telephone Encounter (Signed)
07/03/2023 1835  Name: Catherine Baxter MRN: [id number] DOB: [birth year]  Reason for Call:  Transition of Care Hospital Discharge Call  Contact Status: Patient Contact Status: Complete  Language assistant needed: Interpreter Mode: Interpreter Not Needed        Follow-Up Questions: Do You Have Any Concerns About Your Health As You Heal From Delivery?: No Do You Have Any Concerns About Your Infants Health?: No  Edinburgh Postnatal Depression Scale:  In the Past 7 Days:    PHQ2-9 Depression Scale:     Discharge Follow-up: Edinburgh score requires follow up?:  (Patient states that she had a nurse home visit yesterday and completed EPDS with home visit nurse. She states that she is feeling good emotionally. EPDS declined at this time.) Patient was advised of the following resources:: Breastfeeding Support Group, Support Group  Post-discharge interventions: Reviewed Newborn Safe Sleep Practices  Signature Signe Colt

## 2023-07-28 ENCOUNTER — Ambulatory Visit: Payer: PRIVATE HEALTH INSURANCE | Admitting: Obstetrics and Gynecology

## 2023-08-04 ENCOUNTER — Encounter: Payer: Self-pay | Admitting: Obstetrics & Gynecology

## 2023-08-04 ENCOUNTER — Other Ambulatory Visit: Payer: Self-pay

## 2023-08-04 ENCOUNTER — Ambulatory Visit (INDEPENDENT_AMBULATORY_CARE_PROVIDER_SITE_OTHER): Payer: PRIVATE HEALTH INSURANCE | Admitting: Obstetrics & Gynecology

## 2023-08-04 NOTE — Progress Notes (Signed)
Post Partum Visit Note  Catherine Baxter is a 42 y.o. Z6X0960 female who presents for a postpartum visit. She is 7 weeks postpartum following a normal spontaneous vaginal delivery.  I have fully reviewed the prenatal and intrapartum course. The delivery was at [redacted]w[redacted]d gestational weeks.  Anesthesia: epidural. Postpartum course has been good. Baby is doing well. Baby is feeding by bottle - Enfamil . Bleeding thin lochia. Bowel function is normal. Bladder function is normal. Patient is not sexually active. Contraception method is Depo-Provera injections. Postpartum depression screening: negative.   The pregnancy intention screening data noted above was reviewed. Potential methods of contraception were discussed. The patient elected to proceed with No data recorded.   Edinburgh Postnatal Depression Scale - 08/04/23 1356       Edinburgh Postnatal Depression Scale:  In the Past 7 Days   I have been able to laugh and see the funny side of things. 0    I have looked forward with enjoyment to things. 0    I have blamed myself unnecessarily when things went wrong. 0    I have been anxious or worried for no good reason. 0    I have felt scared or panicky for no good reason. 0    Things have been getting on top of me. 0    I have been so unhappy that I have had difficulty sleeping. 0    I have felt sad or miserable. 0    I have been so unhappy that I have been crying. 0    The thought of harming myself has occurred to me. 0    Edinburgh Postnatal Depression Scale Total 0             Health Maintenance Due  Topic Date Due   Cervical Cancer Screening (HPV/Pap Cotest)  Never done   INFLUENZA VACCINE  06/05/2023   COVID-19 Vaccine (3 - 2023-24 season) 07/06/2023    The following portions of the patient's history were reviewed and updated as appropriate: allergies, current medications, past family history, past medical history, past social history, past surgical history, and problem  list.  Review of Systems Pertinent items are noted in HPI.  Objective:  BP 123/76   Pulse 78   Wt 140 lb (63.5 kg)   Breastfeeding No   BMI 25.61 kg/m    General:  alert, cooperative, and no distress   Breasts:  not indicated  Lungs: Effort normal  Heart:  regular rate and rhythm  Abdomen: soft, non-tender; bowel sounds normal; no masses,  no organomegaly   Wound   GU exam:  not indicated       Assessment:    There are no diagnoses linked to this encounter.  normal postpartum exam.   Plan:   Essential components of care per ACOG recommendations:  1.  Mood and well being: Patient with negative depression screening today. Reviewed local resources for support.  - Patient tobacco use? No.   - hx of drug use? No.    2. Infant care and feeding:  -Patient currently breastmilk feeding? No.  -Social determinants of health (SDOH) reviewed in EPIC. No concerns  3. Sexuality, contraception and birth spacing - Patient does not want a pregnancy in the next year.  Desired family size is 5 children.  - Reviewed reproductive life planning. Reviewed contraceptive methods based on pt preferences and effectiveness.  Patient desired Hormonal Injection today.   - Discussed birth spacing of 18 months  4. Sleep and  fatigue -Encouraged family/partner/community support of 4 hrs of uninterrupted sleep to help with mood and fatigue  5. Physical Recovery  - Discussed patients delivery and complications. She describes her labor as good. - Patient had a Vaginal, no problems at delivery. Patient had a 1st degree laceration. Perineal healing reviewed. Patient expressed understanding - Patient has urinary incontinence? No. - Patient is safe to resume physical and sexual activity  6.  Health Maintenance - HM due items addressed Yes - Last pap smear No results found for: "DIAGPAP" Pap smear not done at today's visit.  -Breast Cancer screening indicated? Yes. Patient referred today for mammogram.    7. Chronic Disease/Pregnancy Condition follow up:  routine gyn care  - PCP follow up  Scheryl Darter, MD [facility], [facility]

## 2023-08-29 ENCOUNTER — Ambulatory Visit: Payer: Self-pay | Admitting: Obstetrics and Gynecology

## 2024-02-02 NOTE — Progress Notes (Unsigned)
 ANNUAL EXAM Patient name: Catherine Baxter MRN [id number]  Date of birth: Jun 28, 1981 Chief Complaint:   No chief complaint on file.  History of Present Illness:   Catherine Baxter is a 43 y.o. B1Y7829 {race:25618} female being seen today for a routine annual exam.  Current complaints: ***  No LMP recorded. Patient has had an injection.   The pregnancy intention screening data noted above was reviewed. Potential methods of contraception were discussed. The patient elected to proceed with No data recorded.   Last pap ***. Results were: {Pap findings:25134}. H/O abnormal pap: {yes/yes***/no:23866} Last mammogram: ***. Results were: {normal, abnormal, n/a:23837}. Family h/o breast cancer: {yes***/no:23838} Last colonoscopy: ***. Results were: {normal, abnormal, n/a:23837}. Family h/o colorectal cancer: {yes***/no:23838}      No data to display               No data to display           Review of Systems:   Pertinent items are noted in HPI Denies any headaches, blurred vision, fatigue, shortness of breath, chest pain, abdominal pain, abnormal vaginal discharge/itching/odor/irritation, problems with periods, bowel movements, urination, or intercourse unless otherwise stated above. Pertinent History Reviewed:  Reviewed past medical,surgical, social and family history.  Reviewed problem list, medications and allergies. Physical Assessment:  There were no vitals filed for this visit.There is no height or weight on file to calculate BMI.        Physical Examination:   General appearance - well appearing, and in no distress  Mental status - alert, oriented to person, place, and time  Psych:  She has a normal mood and affect  Skin - warm and dry, normal color, no suspicious lesions noted  Chest - effort normal, all lung fields clear to auscultation bilaterally  Heart - normal rate and regular rhythm  Neck:  midline trachea, no thyromegaly or nodules  Breasts - breasts appear  normal, no suspicious masses, no skin or nipple changes or  axillary nodes  Abdomen - soft, nontender, nondistended, no masses or organomegaly  Pelvic - VULVA: normal appearing vulva with no masses, tenderness or lesions  VAGINA: normal appearing vagina with normal color and discharge, no lesions  CERVIX: normal appearing cervix without discharge or lesions, no CMT  Thin prep pap is {Desc; done/not:10129} *** HR HPV cotesting  UTERUS: uterus is felt to be normal size, shape, consistency and nontender   ADNEXA: No adnexal masses or tenderness noted.  Extremities:  No swelling or varicosities noted  Chaperone present for exam  No results found for this or any previous visit (from the past 24 hours).  Assessment & Plan:  1. Encounter for annual routine gynecological examination (Primary) 2. Cervical cancer screening  - Cervical cancer screening: Discussed guidelines. Pap with HPV *** - Gardasil: {Blank single:19197::"***","has not yet had. Will provide information","completed","has not yet had. Counseling provided and she declines","Has not yet had. Counseling provided and pt accepts"} - STD Testing: {Blank single:19197::"accepts","declines","not indicated"} - Birth Control: Discussed options and their risks, benefits and common side effects; discussed VTE with estrogen containing options. Desires: {Birth control type:23956} - Breast Health: Encouraged self breast awareness/SBE. Teaching provided. Discussed limits of clinical breast exam for detecting breast cancer. Rx given for MXR - Colonoscopy: {TCS f/u:25213::"@ 45yo"}, or sooner if problems - F/U 12 months and prn   No orders of the defined types were placed in this encounter.   Meds: No orders of the defined types were placed in this encounter.   Follow-up: No  follow-ups on file.  Ralene Muskrat, [place] 02/02/2024 5:47 PM

## 2024-02-05 ENCOUNTER — Ambulatory Visit (INDEPENDENT_AMBULATORY_CARE_PROVIDER_SITE_OTHER): Payer: Medicaid Other | Admitting: Physician Assistant

## 2024-02-05 ENCOUNTER — Encounter: Payer: Self-pay | Admitting: Physician Assistant

## 2024-02-05 ENCOUNTER — Other Ambulatory Visit (HOSPITAL_COMMUNITY)
Admission: RE | Admit: 2024-02-05 | Discharge: 2024-02-05 | Disposition: A | Discharge status: Home / Self Care | Source: Ambulatory Visit | Attending: Physician Assistant | Admitting: Physician Assistant

## 2024-02-05 VITALS — BP 114/62 | HR 66 | Ht 62.0 in | Wt 134.5 lb

## 2024-02-05 DIAGNOSIS — Z1331 Encounter for screening for depression: Secondary | ICD-10-CM | POA: Diagnosis not present

## 2024-02-05 DIAGNOSIS — Z124 Encounter for screening for malignant neoplasm of cervix: Secondary | ICD-10-CM | POA: Insufficient documentation

## 2024-02-05 DIAGNOSIS — Z3202 Encounter for pregnancy test, result negative: Secondary | ICD-10-CM | POA: Diagnosis not present

## 2024-02-05 DIAGNOSIS — Z01419 Encounter for gynecological examination (general) (routine) without abnormal findings: Secondary | ICD-10-CM

## 2024-02-05 DIAGNOSIS — Z1231 Encounter for screening mammogram for malignant neoplasm of breast: Secondary | ICD-10-CM | POA: Diagnosis not present

## 2024-02-05 DIAGNOSIS — Z30013 Encounter for initial prescription of injectable contraceptive: Secondary | ICD-10-CM

## 2024-02-05 LAB — POCT PREGNANCY, URINE: Preg Test, Ur: NEGATIVE

## 2024-02-05 MED ORDER — MEDROXYPROGESTERONE ACETATE 150 MG/ML IM SUSY
150.0000 mg | PREFILLED_SYRINGE | Freq: Once | INTRAMUSCULAR | Status: AC
Start: 2024-02-05 — End: 2024-02-05
  Administered 2024-02-05: 150 mg via INTRAMUSCULAR

## 2024-02-11 ENCOUNTER — Ambulatory Visit

## 2024-02-11 ENCOUNTER — Telehealth: Payer: Self-pay | Admitting: *Deleted

## 2024-02-11 LAB — CYTOLOGY - PAP
Comment: NEGATIVE
Comment: NEGATIVE
Comment: NEGATIVE
Diagnosis: NEGATIVE
HPV 16: NEGATIVE
HPV 18 / 45: NEGATIVE
High risk HPV: POSITIVE — AB

## 2024-02-11 NOTE — Telephone Encounter (Signed)
 I called Catherine Baxter and gave her results and recommendation. I answered her questions and she voices understanding. Nancy Fetter

## 2024-02-11 NOTE — Telephone Encounter (Signed)
-----   Message from Ralene Muskrat sent at 02/11/2024  4:14 PM EDT ----- Patient without MyChart access. Please call and let Catherine Baxter know she should have a repeat pap in one year.

## 2024-02-17 ENCOUNTER — Ambulatory Visit

## 2024-02-24 ENCOUNTER — Ambulatory Visit
Admission: RE | Admit: 2024-02-24 | Discharge: 2024-02-24 | Disposition: A | Discharge status: Home / Self Care | Source: Ambulatory Visit | Attending: Physician Assistant | Admitting: Physician Assistant

## 2024-02-24 DIAGNOSIS — Z1231 Encounter for screening mammogram for malignant neoplasm of breast: Secondary | ICD-10-CM

## 2024-04-26 ENCOUNTER — Ambulatory Visit

## 2024-04-26 ENCOUNTER — Other Ambulatory Visit: Payer: Self-pay

## 2024-04-26 VITALS — BP 106/67 | HR 82 | Wt 136.9 lb

## 2024-04-26 DIAGNOSIS — Z3042 Encounter for surveillance of injectable contraceptive: Secondary | ICD-10-CM | POA: Diagnosis not present

## 2024-04-26 MED ORDER — MEDROXYPROGESTERONE ACETATE 150 MG/ML IM SUSY
150.0000 mg | PREFILLED_SYRINGE | Freq: Once | INTRAMUSCULAR | Status: AC
Start: 2024-04-26 — End: 2024-04-26
  Administered 2024-04-26: 150 mg via INTRAMUSCULAR

## 2024-04-26 NOTE — Progress Notes (Signed)
 Devere Barb here for Depo-Provera  Injection. Injection administered without complication. Patient will return in 3 months for next injection between 07/12/24 and 07/26/24. Next annual visit due April 2026.      [place], RN

## 2024-07-12 ENCOUNTER — Ambulatory Visit

## 2024-07-12 ENCOUNTER — Other Ambulatory Visit: Payer: Self-pay

## 2024-07-12 VITALS — BP 114/57 | HR 86 | Ht 62.0 in | Wt 135.2 lb

## 2024-07-12 DIAGNOSIS — Z3042 Encounter for surveillance of injectable contraceptive: Secondary | ICD-10-CM

## 2024-07-12 MED ORDER — MEDROXYPROGESTERONE ACETATE 150 MG/ML IM SUSY
150.0000 mg | PREFILLED_SYRINGE | Freq: Once | INTRAMUSCULAR | Status: AC
Start: 2024-07-12 — End: 2024-07-12
  Administered 2024-07-12: 150 mg via INTRAMUSCULAR

## 2024-07-12 NOTE — Progress Notes (Signed)
 Catherine Baxter here for Depo-Provera  Injection. Injection administered without complication. Patient will return in 3 months for next injection between Nov 24th and Dec 8th. Next annual visit due 02/2025.  Next Depo Provera  scheduled for 09/27/24 at 1400.    Bralon Antkowiak,RN 07/12/2024  2:11 PM

## 2024-09-27 ENCOUNTER — Ambulatory Visit

## 2024-10-06 ENCOUNTER — Other Ambulatory Visit: Payer: Self-pay

## 2024-10-06 ENCOUNTER — Ambulatory Visit: Admitting: *Deleted

## 2024-10-06 VITALS — BP 119/57 | HR 99 | Ht 62.0 in | Wt 140.4 lb

## 2024-10-06 DIAGNOSIS — Z3042 Encounter for surveillance of injectable contraceptive: Secondary | ICD-10-CM | POA: Diagnosis not present

## 2024-10-06 MED ORDER — MEDROXYPROGESTERONE ACETATE 150 MG/ML IM SUSY
150.0000 mg | PREFILLED_SYRINGE | Freq: Once | INTRAMUSCULAR | Status: AC
  Administered 2024-10-06: 150 mg via INTRAMUSCULAR

## 2024-10-06 NOTE — Progress Notes (Signed)
 Devere Barb here for Depo-Provera  Injection. Injection administered without complication. Patient will return in 3 months for next injection between 12/22/24 and 01/05/25. Next annual visit due after 02/04/25.  Rock Skip PEAK 10/06/2024  2:14 PM

## 2024-12-22 ENCOUNTER — Ambulatory Visit
